# Patient Record
Sex: Female | Born: 1935 | Race: White | Hispanic: No | Marital: Single
Health system: Southern US, Community
[De-identification: ages and names within clinical notes are randomized; demographics above are authoritative.]

---

## 2013-03-22 ENCOUNTER — Inpatient Hospital Stay: Payer: Self-pay | Admitting: Specialist

## 2013-03-22 LAB — CBC WITH DIFFERENTIAL/PLATELET
Basophil #: 0 10*3/uL (ref 0.0–0.1)
Basophil %: 0.5 %
Eosinophil #: 0.2 10*3/uL (ref 0.0–0.7)
Eosinophil %: 2.1 %
HCT: 36.8 % (ref 35.0–47.0)
HGB: 12.5 g/dL (ref 12.0–16.0)
Lymphocyte #: 1.2 10*3/uL (ref 1.0–3.6)
MCHC: 33.8 g/dL (ref 32.0–36.0)
MCV: 89 fL (ref 80–100)
Monocyte %: 7.3 %
Neutrophil #: 6.7 10*3/uL — ABNORMAL HIGH (ref 1.4–6.5)
Neutrophil %: 76 %
Platelet: 160 10*3/uL (ref 150–440)
RBC: 4.14 10*6/uL (ref 3.80–5.20)
RDW: 13.6 % (ref 11.5–14.5)
WBC: 8.8 10*3/uL (ref 3.6–11.0)

## 2013-03-22 LAB — COMPREHENSIVE METABOLIC PANEL
Albumin: 3.8 g/dL (ref 3.4–5.0)
Anion Gap: 10 (ref 7–16)
BUN: 24 mg/dL — ABNORMAL HIGH (ref 7–18)
Co2: 24 mmol/L (ref 21–32)
Creatinine: 1.16 mg/dL (ref 0.60–1.30)
Glucose: 104 mg/dL — ABNORMAL HIGH (ref 65–99)
SGOT(AST): 235 U/L — ABNORMAL HIGH (ref 15–37)
Sodium: 140 mmol/L (ref 136–145)
Total Protein: 6.9 g/dL (ref 6.4–8.2)

## 2013-03-23 LAB — TROPONIN I
Troponin-I: 0.85 ng/mL — ABNORMAL HIGH
Troponin-I: 0.42 ng/mL — ABNORMAL HIGH

## 2013-03-23 LAB — CK-MB
CK-MB: 2.8 ng/mL (ref 0.5–3.6)
CK-MB: 2.7 ng/mL (ref 0.5–3.6)

## 2013-03-24 DIAGNOSIS — I369 Nonrheumatic tricuspid valve disorder, unspecified: Secondary | ICD-10-CM

## 2014-10-04 ENCOUNTER — Ambulatory Visit: Payer: Self-pay | Admitting: Ophthalmology

## 2014-11-12 ENCOUNTER — Ambulatory Visit: Payer: Self-pay | Admitting: Internal Medicine

## 2014-12-04 LAB — CBC
HCT: 35.7 % (ref 35.0–47.0)
HGB: 11.8 g/dL — ABNORMAL LOW (ref 12.0–16.0)
MCH: 30 pg (ref 26.0–34.0)
MCHC: 33.1 g/dL (ref 32.0–36.0)
MCV: 91 fL (ref 80–100)
PLATELETS: 290 10*3/uL (ref 150–440)
RBC: 3.93 10*6/uL (ref 3.80–5.20)
RDW: 12.9 % (ref 11.5–14.5)
WBC: 9.7 10*3/uL (ref 3.6–11.0)

## 2014-12-04 LAB — COMPREHENSIVE METABOLIC PANEL
ALT: 14 U/L
ANION GAP: 10 (ref 7–16)
Albumin: 3.3 g/dL — ABNORMAL LOW (ref 3.4–5.0)
Alkaline Phosphatase: 53 U/L
BUN: 34 mg/dL — ABNORMAL HIGH (ref 7–18)
Bilirubin,Total: 0.3 mg/dL (ref 0.2–1.0)
CHLORIDE: 103 mmol/L (ref 98–107)
CO2: 26 mmol/L (ref 21–32)
CREATININE: 1.66 mg/dL — AB (ref 0.60–1.30)
Calcium, Total: 8.6 mg/dL (ref 8.5–10.1)
EGFR (Non-African Amer.): 31 — ABNORMAL LOW
GFR CALC AF AMER: 37 — AB
Glucose: 115 mg/dL — ABNORMAL HIGH (ref 65–99)
Osmolality: 286 (ref 275–301)
POTASSIUM: 3.7 mmol/L (ref 3.5–5.1)
SGOT(AST): 21 U/L (ref 15–37)
SODIUM: 139 mmol/L (ref 136–145)
Total Protein: 6.5 g/dL (ref 6.4–8.2)

## 2014-12-04 LAB — APTT: Activated PTT: 29.9 secs (ref 23.6–35.9)

## 2014-12-04 LAB — PROTIME-INR
INR: 0.9
Prothrombin Time: 12.4 secs (ref 11.5–14.7)

## 2014-12-04 LAB — MAGNESIUM: Magnesium: 1.7 mg/dL — ABNORMAL LOW

## 2014-12-04 LAB — TROPONIN I

## 2014-12-04 LAB — LIPASE, BLOOD: Lipase: 72 U/L — ABNORMAL LOW (ref 73–393)

## 2014-12-05 ENCOUNTER — Inpatient Hospital Stay: Payer: Self-pay | Admitting: Surgery

## 2014-12-05 LAB — URINALYSIS, COMPLETE
BACTERIA: NONE SEEN
BILIRUBIN, UR: NEGATIVE
Blood: NEGATIVE
GLUCOSE, UR: NEGATIVE mg/dL (ref 0–75)
KETONE: NEGATIVE
Nitrite: NEGATIVE
Ph: 5 (ref 4.5–8.0)
Protein: NEGATIVE
RBC,UR: 3 /HPF (ref 0–5)
SPECIFIC GRAVITY: 1.026 (ref 1.003–1.030)
Squamous Epithelial: 1

## 2014-12-06 LAB — BASIC METABOLIC PANEL
Anion Gap: 12 (ref 7–16)
BUN: 27 mg/dL — ABNORMAL HIGH (ref 7–18)
Calcium, Total: 8 mg/dL — ABNORMAL LOW (ref 8.5–10.1)
Chloride: 110 mmol/L — ABNORMAL HIGH (ref 98–107)
Co2: 23 mmol/L (ref 21–32)
Creatinine: 1.66 mg/dL — ABNORMAL HIGH (ref 0.60–1.30)
EGFR (African American): 37 — ABNORMAL LOW
EGFR (Non-African Amer.): 31 — ABNORMAL LOW
Glucose: 105 mg/dL — ABNORMAL HIGH (ref 65–99)
Osmolality: 294 (ref 275–301)
Potassium: 4.1 mmol/L (ref 3.5–5.1)
Sodium: 145 mmol/L (ref 136–145)

## 2014-12-06 LAB — CBC WITH DIFFERENTIAL/PLATELET
Basophil #: 0 10*3/uL (ref 0.0–0.1)
Basophil %: 0.4 %
Eosinophil #: 0 10*3/uL (ref 0.0–0.7)
Eosinophil %: 0.3 %
HCT: 26.3 % — ABNORMAL LOW (ref 35.0–47.0)
HGB: 8.7 g/dL — ABNORMAL LOW (ref 12.0–16.0)
Lymphocyte #: 1 10*3/uL (ref 1.0–3.6)
Lymphocyte %: 7.3 %
MCH: 30.1 pg (ref 26.0–34.0)
MCHC: 32.9 g/dL (ref 32.0–36.0)
MCV: 92 fL (ref 80–100)
Monocyte #: 1.4 x10 3/mm — ABNORMAL HIGH (ref 0.2–0.9)
Monocyte %: 10.9 %
Neutrophil #: 10.7 10*3/uL — ABNORMAL HIGH (ref 1.4–6.5)
Neutrophil %: 81.1 %
Platelet: 238 10*3/uL (ref 150–440)
RBC: 2.87 10*6/uL — ABNORMAL LOW (ref 3.80–5.20)
RDW: 12.8 % (ref 11.5–14.5)
WBC: 13.2 10*3/uL — ABNORMAL HIGH (ref 3.6–11.0)

## 2014-12-06 LAB — TSH: Thyroid Stimulating Horm: 2.22 u[IU]/mL

## 2014-12-06 LAB — MAGNESIUM: Magnesium: 1.6 mg/dL — ABNORMAL LOW

## 2014-12-07 LAB — CBC WITH DIFFERENTIAL/PLATELET
BASOS ABS: 0.1 10*3/uL (ref 0.0–0.1)
BASOS PCT: 0.3 %
Eosinophil #: 0 10*3/uL (ref 0.0–0.7)
Eosinophil %: 0.1 %
HCT: 24.3 % — AB (ref 35.0–47.0)
HGB: 7.7 g/dL — AB (ref 12.0–16.0)
Lymphocyte #: 1.2 10*3/uL (ref 1.0–3.6)
Lymphocyte %: 6.2 %
MCH: 29.1 pg (ref 26.0–34.0)
MCHC: 31.8 g/dL — ABNORMAL LOW (ref 32.0–36.0)
MCV: 92 fL (ref 80–100)
MONOS PCT: 8.7 %
Monocyte #: 1.7 x10 3/mm — ABNORMAL HIGH (ref 0.2–0.9)
NEUTROS ABS: 16.2 10*3/uL — AB (ref 1.4–6.5)
Neutrophil %: 84.7 %
Platelet: 242 10*3/uL (ref 150–440)
RBC: 2.65 10*6/uL — ABNORMAL LOW (ref 3.80–5.20)
RDW: 13.3 % (ref 11.5–14.5)
WBC: 19.1 10*3/uL — AB (ref 3.6–11.0)

## 2014-12-07 LAB — BASIC METABOLIC PANEL
Anion Gap: 10 (ref 7–16)
BUN: 23 mg/dL — ABNORMAL HIGH (ref 7–18)
CALCIUM: 8.5 mg/dL (ref 8.5–10.1)
CHLORIDE: 115 mmol/L — AB (ref 98–107)
Co2: 24 mmol/L (ref 21–32)
Creatinine: 1.34 mg/dL — ABNORMAL HIGH (ref 0.60–1.30)
EGFR (African American): 48 — ABNORMAL LOW
EGFR (Non-African Amer.): 39 — ABNORMAL LOW
GLUCOSE: 97 mg/dL (ref 65–99)
Osmolality: 300 (ref 275–301)
Potassium: 4.2 mmol/L (ref 3.5–5.1)
Sodium: 149 mmol/L — ABNORMAL HIGH (ref 136–145)

## 2014-12-08 LAB — CBC WITH DIFFERENTIAL/PLATELET
BASOS PCT: 0.2 %
Basophil #: 0 10*3/uL (ref 0.0–0.1)
Eosinophil #: 0.1 10*3/uL (ref 0.0–0.7)
Eosinophil %: 0.6 %
HCT: 29.1 % — AB (ref 35.0–47.0)
HGB: 9.5 g/dL — AB (ref 12.0–16.0)
Lymphocyte #: 0.8 10*3/uL — ABNORMAL LOW (ref 1.0–3.6)
Lymphocyte %: 5.8 %
MCH: 28.7 pg (ref 26.0–34.0)
MCHC: 32.6 g/dL (ref 32.0–36.0)
MCV: 88 fL (ref 80–100)
Monocyte #: 1.1 x10 3/mm — ABNORMAL HIGH (ref 0.2–0.9)
Monocyte %: 8 %
NEUTROS ABS: 11.6 10*3/uL — AB (ref 1.4–6.5)
Neutrophil %: 85.4 %
Platelet: 228 10*3/uL (ref 150–440)
RBC: 3.31 10*6/uL — AB (ref 3.80–5.20)
RDW: 14.8 % — ABNORMAL HIGH (ref 11.5–14.5)
WBC: 13.5 10*3/uL — ABNORMAL HIGH (ref 3.6–11.0)

## 2014-12-08 LAB — BASIC METABOLIC PANEL
Anion Gap: 7 (ref 7–16)
BUN: 19 mg/dL — ABNORMAL HIGH (ref 7–18)
CALCIUM: 8.6 mg/dL (ref 8.5–10.1)
CO2: 26 mmol/L (ref 21–32)
Chloride: 115 mmol/L — ABNORMAL HIGH (ref 98–107)
Creatinine: 0.98 mg/dL (ref 0.60–1.30)
EGFR (African American): >60
EGFR (Non-African Amer.): 57 — ABNORMAL LOW
GLUCOSE: 99 mg/dL (ref 65–99)
OSMOLALITY: 297 (ref 275–301)
POTASSIUM: 3 mmol/L — AB (ref 3.5–5.1)
Sodium: 148 mmol/L — ABNORMAL HIGH (ref 136–145)

## 2014-12-08 LAB — MAGNESIUM: MAGNESIUM: 1.8 mg/dL

## 2014-12-09 LAB — URINALYSIS, COMPLETE
BILIRUBIN, UR: NEGATIVE
GLUCOSE, UR: NEGATIVE mg/dL (ref 0–75)
KETONE: NEGATIVE
Nitrite: POSITIVE
Ph: 6 (ref 4.5–8.0)
Protein: 25
SPECIFIC GRAVITY: 1.005 (ref 1.003–1.030)

## 2014-12-09 LAB — POTASSIUM: Potassium: 4.5 mmol/L (ref 3.5–5.1)

## 2014-12-13 ENCOUNTER — Ambulatory Visit: Payer: Self-pay | Admitting: Internal Medicine

## 2015-01-11 DEATH — deceased

## 2015-03-04 NOTE — Consult Note (Signed)
Brief Consult Note: Diagnosis: multiple rib fractures.   Patient was seen by consultant.   Discussed with Attending MD.   Comments: as recent near cardiac arrest would recommend medical admission and observation, repeat CXR in am.  Electronic Signatures: Natale LayBird, Belina Mandile (MD)  (Signed 11-May-14 20:27)  Authored: Brief Consult Note   Last Updated: 11-May-14 20:27 by Natale LayBird, Jaydrien Wassenaar (MD)

## 2015-03-04 NOTE — H&P (Signed)
PATIENT NAMDebroah [name]:  [name], [name] MR#:  [id number] DATE OF BIRTH:  1922/09/18  DATE OF ADMISSION:  03/22/2013  PRIMARY CARE PHYSICIAN: Dr. Quillian [name]   ED REFERRING PHYSICIAN: Dr. Marge DuncansMelinda [name], M.D.   CHIEF COMPLAINT: Bilateral chest pain.   HISTORY OF PRESENT ILLNESS: The patient is an 81 year old white female who currently resides at [facility] who has a history of hypertension, restless leg syndrome, depression, anxiety, hypothyroidism, gastroesophageal reflux disease who [facility] and while eating a piece of steak, the patient was started choking on the steak and her daughter attempted to perform cardiopulmonary resuscitation [facility] were [facility]; so a nurse, who was eating in the restaurant, started doing cardiopulmonary resuscitation on the patient by placing her on the floor and  started doing chest compressions and did the Heimlich maneuver and was able to sweep the piece of steak out of her mouth. The patient was brought to the ED in the ER, she was complaining of having sharp chest pain and was noted to have 4 rib fractures. We are asked to admit the patient for chest pain control. The patient states that she is hurting every time she takes a deep breath. She is not currently short of breath. She is not having any fevers or chills. She is denying any abdominal pain, nausea, vomiting, or diarrhea or any urinary symptoms.   PAST MEDICAL HISTORY: History of colonic rupture had to have a colostomy.  History of basal cell cancer of the skin, history of hypertension and restless leg syndrome. Has depression and anxiety, has a history of hypothyroidism, has a history of GERD.   PAST SURGICAL HISTORY: Status post colostomy, history of left hip fracture, history of pelvic fracture.   ALLERGIES: CODEINE, KEFLEX, NITROFURANTOIN AND MILK.   CURRENT MEDICATIONS: Clonazepam 0.5 mg at bedtime, Colace 100 mg 1 tab p.o.  b.i.d., Effexor 75 extended release daily, Lasix 20 mg 1 tab p.o. daily, and galantamine 12 mg 1 tab p.o. b.i.d., metoprolol tartrate 100 one tab p.o. b.i.d., Norvasc 5 p.o. daily, pravastatin 20 mg at bedtime, Requip 0.25 one tab p.o. b.i.d., Synthroid 50 mcg 1 tab p.o. daily, tramadol 100 q.4 p.r.n. for pain, Tylenol 325 three tabs q.8 p.r.n., Xanax 0.25 four times a day, Zantac 150 mg daily, Zoloft 150 mg daily.   SOCIAL HISTORY: Does not smoke. Does not drink. No drugs.   FAMILY HISTORY: Positive for hypertension.   REVIEW OF SYSTEMS:   CONSTITUTIONAL: Denies any fevers. Denies any fatigue or weakness. Complains of sharp bilateral chest pain. No weight loss. No weight gain.   EYES: No blurred or double vision. No redness. No inflammation. No glaucoma. No cataracts.   ENT: No tinnitus. No ear pain. Denies any seasonal or year-round allergies. No nasal discharge. No difficulty swallowing.   RESPIRATORY: Denies any cough, wheezing. No hemoptysis. Has no history of chronic obstructive pulmonary disease or tuberculosis.   CARDIOVASCULAR: Complains of bilateral chest pain. Denies any orthopnea, edema, no arrhythmia. No palpitations. No syncope.   GASTROINTESTINAL: No nausea, vomiting, diarrhea. No abdominal pain. No hematemesis. No melena. No ulcer. No GERD.   GENITOURINARY: Denies any dysuria, hematuria, renal calculus or frequency.   ENDOCRINE: Denies any polydipsia, nocturia or thyroid problems.   HEMATOLOGIC AND LYMPHATIC: Denies any major bruisability or bleeding.   SKIN: No acne. No rash. No changes in mole, hair or skin.   MUSCULOSKELETAL: No pain in the neck, back  or shoulder. Complains of pain in her ribs.   NEUROLOGIC: No numbness. No CVA. No transient ischemic attack. No seizures.   PSYCHIATRIC: Does have a history of anxiety, depression; and according to her daughter, has some mood disorder.   PHYSICAL EXAMINATION: VITAL SIGNS: Temperature 98.6, pulse 69, respirations 16,  blood pressure 141/56, O2 94%.   GENERAL: The patient is a thin, frail, Caucasian female in mild distress due to pain.   HEENT: Head atraumatic, normocephalic. Pupils equally round, reactive to light and accommodation. There is no conjunctival pallor. No scleral icterus. Nasal exam shows no drainage or ulceration.   OROPHARYNX: Clear without any exudate. External ear exam shows no drainage or erythema.   NECK: There is no thyromegaly. No carotid bruits.   CARDIOVASCULAR: Regular rate and rhythm. No murmurs, rubs, clicks or gallops. PMI is not displaced.   LUNGS: Diminished breath sounds, but there are no rales, rhonchi or wheezing.   GI: There are positive bowel sounds x4. There is no guarding. No rebound. No hepatosplenomegaly.   GENITOURINARY: Deferred.   MUSCULOSKELETAL: The patient has no erythema or swelling involving any of the joints.   SKIN: There is no rash noted.   LYMPHATICS: No cervical lymph nodes or other lymphadenopathy noted.   VASCULAR: Good DP, PT pulses.   NEUROLOGICAL:  Cranial nerves II through XII grossly intact. Strength 5/5 all 4 extremities.   PSYCHIATRIC: The patient is a little anxious.   EXTREMITIES: No clubbing, cyanosis or edema.   LABORATORY DATA: CPK 75, CK-MB 1.4. Glucose 104, BUN 24, creatinine 1.16, sodium 140, potassium 3.6, chloride 106, CO2 is 24, calcium 9.0, bilirubin total 0.2, alkaline phosphatase is 78, ALT is 197, AST is 235, total protein 6.9, troponin 0.19. Chest x-ray shows bilateral rib fractures.  Official radiology report is currently pending. EKG shows nonspecific ST-T wave changes.   ASSESSMENT AND PLAN: The patient is an 81 year old white female status post cardiopulmonary resuscitation due to choking on food, who had cardiopulmonary resuscitation performed, now has multiple rib fractures.  1. Chest pain due to bilateral multiple rib fractures. At this time due to the severity of her pain and multiple rib fractures, she will be  admitted for pain control. At this time, we will use morphine.   According to her daughter, she is very is sensitive to oral pain medications including Percocet, oxycodone, et Karie Soda. She states that morphine, she is usually okay with as well as Ultram, which we will continue.  I will also add Motrin to her current regimen to help with the anti-inflammatory aspect of her pain.  2. Elevated troponin likely due to cardiac resuscitation. We will follow cardiac enzymes.  3. Hypertension: We will hold Norvasc.  The patient's blood pressure did drop after receiving morphine. We will monitor her blood pressure.  4. Depression and anxiety. Will continue Xanax and Zoloft.  5. Hyperlipidemia: We will continue pravastatin.  6. Miscellaneous. We will do Lovenox for deep vein thrombosis prophylaxis.   TIME SPENT:  45 minutes.   ____________________________ Lacie Scotts Allena Katz, MD shp:rw D: 03/22/2013 20:48:17 ET T: 03/23/2013 10:09:59 ET JOB#: [id number]  cc: Chantry Headen H. Allena Katz, MD, <Dictator> Charise Carwin MD ELECTRONICALLY SIGNED 03/27/2013 14:39

## 2015-03-04 NOTE — Discharge Summary (Signed)
PATIENT NAMESAMEENA, [name] MR#:  [id number] DATE OF BIRTH:  1922/10/26  DATE OF ADMISSION:  03/22/2013 DATE OF DISCHARGE:  03/24/2013  For a detailed note, please take a look at the history and physical done on admission by Dr. Auburn Bilberry.   DIAGNOSES AT DISCHARGE: 1.  Chest pain, likely musculoskeletal related to the cardiopulmonary resuscitation she received.  2.  Elevated troponin likely in the setting of trauma from the cardiopulmonary resuscitation. 3.  Dementia.  4.  Hypertension.  5.  Hyperlipidemia.  6.  Hypothyroidism.  7.  Restless legs syndrome. 8.  Anxiety/depression.   The patient is being discharged on a low-sodium, low-fat diet.   ACTIVITY:  As tolerated.  FOLLOWUP:  With the primary care physician at the skilled nursing facility at [facility] in the next 1 to 2 weeks.   DISCHARGE MEDICATIONS: 1.  Galantamine 12 mg b.i.d. 2.  Requip 0.25 mg b.i.d. 3.  Colace 100 mg b.i.d. 4.  Lasix 20 mg daily. 5.  Tylenol 325 three tabs q. 8 hours as needed. 6.  Zoloft 100 mg 1.5 mg daily. 7.  Pravachol 20 mg at bedtime. 8.  Norvasc 5 mg daily. 9.  Synthroid 50 mcg daily. 10.  Zantac 150 mg daily. 11.  Effexor 75 mg daily. 12.  Metoprolol tartrate 100 mg b.i.d. 13.  Tramadol 100 mg q. 4 hours as needed for pain. 14.  Xanax 0.25 mg q.i.d. 15.  Klonopin 0.5 mg at bedtime.   CONSULTANTS DURING THE HOSPITAL COURSE:  Dr. Anda Kraft and Dr. Juliann Pulse from general surgery.  PERTINENT STUDIES DONE DURING THE HOSPITAL COURSE:  As follows: A CT of the chest done noncontrast showing no acute bony abnormality, areas of minimal infiltrate or dependent atelectasis most prominently in the right lower lobe, trace pleural effusions. There is a partial rotation of the right kidney into a more horizontal position. An echocardiogram done showing left ventricular ejection fraction to be 65% to 70%, normal LV function, impaired LV relaxation, mild LVH.  HOSPITAL COURSE:  This is an  81 year old female with medical problems as mentioned above presented to the hospital on 03/22/2013, after having a respiratory arrest secondary to choking on a piece of food.  PROBLEM: 1.  Chest pain with suspected rib fractures. The patient presented to the hospital after eating at a restaurant and choking on a piece of meat and having a Heimlich maneuver performed and receiving some cardiopulmonary resuscitation. When she presented to the ER, she was having chest pain. She also was noted to have a mildly elevated troponin. There was some suspicion that she may have had rib fractures when she got cardiopulmonary resuscitation, although her x-rays on admission did not confirm any rib fractures. She was observed, continued to have some mild chest pain, which was controlled intermittently with some tramadol. The patient underwent a CT chest noncontrast which did not show any evidence of acute rib fractures. She was therefore discharged back to her skilled nursing facility. 2. Elevated troponin. This was likely in the setting of trauma from her receiving cardiopulmonary resuscitation. Her troponin has actually trended down. She did get an echocardiogram which showed normal left ventricular function.  3.  Anxiety/depression. The patient was maintained on her Xanax and Klonopin and her Effexor and Zoloft. She will resume that upon discharge.  4.  Hypothyroidism. The patient was maintained on her Synthroid. She will resume that. 5.  Hypertension. The patient remained hemodynamically stable. She will resume her Norvasc, metoprolol and Lasix upon discharge. 6.  Restless legs syndrome.  The patient was maintained on her Requip and she will also resume that upon discharge.   THE PATIENT IS A FULL CODE.  TIME SPENT ON DISCHARGE:  35 minutes     ____________________________ [name] PancakeVivek J. Cherlynn KaiserSainani, MD vjs:ce D: 03/24/2013 16:50:03 ET T: 03/24/2013 18:13:35 ET JOB#: [id number]  cc: [name] PancakeVivek J. Cherlynn KaiserSainani, MD,  <Dictator> Primary Care Physician Houston SirenVIVEK J SAINANI MD ELECTRONICALLY SIGNED 03/31/2013 20:00

## 2015-03-04 NOTE — Consult Note (Signed)
PATIENT NAMDebroah [name]:  [name], [name] MR#:  [id number] DATE OF BIRTH:  26-Apr-1922  DATE OF CONSULTATION:  03/22/2013  REFERRING PHYSICIAN:   CONSULTING PHYSICIAN:  Randal Yepiz A. Egbert GaribaldiBird, MD  REASON FOR CONSULTATION: Evaluation of multiple rib fractures.   HISTORY OF PRESENT ILLNESS: This is an 81 year old white female visiting her sister for Mother's Day, eating in a local restaurant, apparently got choked on a piece of meat. The patient had multiple attempts at Heimlich maneuvers while in the restaurant. The patient visibly became cyanotic. She was rescued by a local nurse at the restaurant with brief episode of CPR and then dislodgment of the food bolus. She was transferred to the Emergency Room in stable condition. She is complaining of significant chest pain. Chest x-ray demonstrates no evidence of pneumothorax, no hemothorax, but multiple rib fractures on the right side.   ALLERGIES: CODEINE, KEFLEX, NITROFURANTOIN, AND MILK.   PAST MEDICAL HISTORY: Significant for hypertension, osteoarthritis, recent right hip fracture, difficulty walking, hypothyroidism, gastroesophageal reflux disease, restless leg syndrome.   PAST SURGICAL HISTORY:  Repair of right hip fracture 2 years ago.   SOCIAL HISTORY: Noncontributory.   FAMILY HISTORY: Noncontributory.   PHYSICAL EXAMINATION: GENERAL: The patient is alert and oriented, mildly demented.  VITAL SIGNS: Stable.  LUNGS: Clear bilaterally. There is significant tenderness over the chest wall. No crepitus.  ABDOMEN: Soft and nontender.   LABORATORY DATA:  Values were reviewed.   IMPRESSION:  Traumatic rib fractures secondary to cardiopulmonary resuscitation with near cardiac arrest secondary to hypoxia.   RECOMMENDATIONS: Pain medications, chest x-ray, incentive spirometer, pain control, and observation on the medical service.   ____________________________ Redge GainerMark A. Egbert GaribaldiBird, MD mab:sb D: 03/22/2013 20:36:31 ET T: 03/23/2013 09:50:55  ET JOB#: [id number]  cc: Loraine LericheMark A. Egbert GaribaldiBird, MD, <Dictator> Raynald KempMARK A Arra Connaughton MD ELECTRONICALLY SIGNED 03/23/2013 15:04

## 2015-03-13 NOTE — Op Note (Signed)
PATIENT NAMDebroah [name]:  [name], [name] MR#:  [id number] DATE OF BIRTH:  12/03/1921  DATE OF PROCEDURE:  12/05/2014  PREOPERATIVE DIAGNOSIS: Two-part intertrochanteric fracture, left hip.   POSTOPERATIVE DIAGNOSIS: Intertrochanteric fracture of the left hip following an apparent old intertrochanteric fracture of left hip which healed in varus position.   PROCEDURE: Attempted reduction, internal fixation of left intertrochanteric hip fracture using a Synthes dynamic compression screw and sideplate system with a 130 degree standard barrel 4 hole sideplate and a 70 mm lag screw.   SURGEON:  Maryagnes AmosJ. Jeffrey Poggi, MD   ANESTHESIA: Spinal.   FINDINGS: As noted above.   COMPLICATIONS: None.   ESTIMATED BLOOD LOSS: 50 mL.  TOTAL FLUIDS: 1000 mL of crystalloid.  URINE OUTPUT:  100 mL.  TOURNIQUET: None.   DRAINS: None.   CLOSURE: Staples.   BRIEF CLINICAL NOTE: The patient is a 81 year old, pleasantly demented female with multiple medical issues, who lives in a nursing home. Apparently, she fell last night onto her left hip. She was brought to the Emergency Room where x-rays demonstrated a varus impacted intertrochanteric fracture of her left hip. She was admitted to the medicine service for medical stabilization and pain control prior to definitive management of her injury. She has been cleared medically and presents at this time for definitive management of her injury.   PROCEDURE: The patient the patient was brought into the operating room. After adequate spinal anesthesia was obtained, she was lain in the supine position. Her right leg was too stiff to place in the well leg holder, so it was padded well and gently stabilized to the central beam. The left leg was placed in longitudinal traction. Numerous attempts were made to reduce the fracture, but the fracture did not move, suggesting that it was, in fact, an old fracture that had healed in that position with a subsequent new fracture causing her  acute pain. Therefore, it was felt best to proceed with stabilizing the fracture in its existing position.  The leg was positioned with some longitudinal traction and neutral rotation in order to optimize access to the proximal femur. The lateral aspect of the left hip and thigh were prepped with ChloraPrep solution before being draped sterilely. Preoperative antibiotics were administered. After establishing the location of the proximal extent of the incision fluoroscopically, an approximately 4 inch incision was made along the lateral aspect of the proximal femur. The incision was carried down through the subcutaneous tissues to expose the IT band. This was split the length of the incision. The vastus lateralis fascia was divided near its posterior border and the vastus lateralis fibers dissected bluntly along the lines of the fibers to provide access to the lateral aspect of the proximal femur. Using the 130 degree guide, a guidewire was passed up through the femoral neck into the femoral head. After insuring that is was centered both in the AP and lateral projections, it was measured and found to be 70 mm. The guidewire was over reamed 70 mm before the 70 mm screw was inserted. It was driven through within 5 mm of subchondral bone. The 4 hole side plate was slid down over the construct to lie flat against the lateral aspect of the proximal femur. It was then secured using four bicortical placed screws. The adequacy of fracture stabilization and hardware position was verified in AP and lateral projections fluoroscopically and found to be excellent.   The wound was copiously irrigated with sterile saline solution before the iliotibial band was reapproximated using #  0 Vicryl interrupted sutures. The subcutaneous tissues were closed in 2 layers using 2-0 Vicryl interrupted sutures before the skin was closed using staples. A sterile bulky dressing was applied to the lateral aspect of the hip before the patient was  awakened, and returned to the recovery room in satisfactory condition after tolerating the procedure well.    ____________________________ J. Derald Macleod, MD jjp:LT D: 12/05/2014 15:01:15 ET T: 12/05/2014 21:28:01 ET JOB#: [id number]  cc: Maryagnes Amos, MD, <Dictator> JEFF Fidel Levy MD ELECTRONICALLY SIGNED 12/07/2014 17:36

## 2015-03-13 NOTE — H&P (Signed)
PATIENT NAMDebroah [name]:  [name], [name] MR#:  [id number] DATE OF BIRTH:  02-20-1922  DATE OF ADMISSION:  12/05/2014  REFERRING PHYSICIAN: Eartha Inchory R. [name] CeriseForbach, MD   PRIMARY CARE PHYSICIAN: [name].   ADMISSION DIAGNOSIS: Hip fracture.   HISTORY OF PRESENT ILLNESS: This is a 81 year old Caucasian female who presents to the Emergency Room via EMS after sustaining a fall. Details of the circumstances surrounding her fall are unclear; however, it is reported that the patient never had any loss of consciousness, never struck her head, or complained of any symptoms that would indicate cardiopulmonary problems. In the Emergency Department, the patient had an x-ray of her hip, which revealed an intertrochanteric fracture of her left femur which prompted the Emergency Department to call  orthopedic surgery for guidance. They suggested admission to the internal medicine service, for which the Emergency Department staff contacted the hospitalist for admission.   REVIEW OF SYMPTOMS: The patient does not speak coherently or intelligibly consistently enough to contribute to her own review of systems. She actually denied pain when asked; however, she clearly is uncomfortable based on her positioning in bed and her obvious angulated injury to the left hip.   PAST MEDICAL HISTORY: Hypertension, hypothyroidism, dementia, restless leg syndrome, and history of basal cell carcinoma of the skin per older documentation.   PAST SURGICAL HISTORY: The patient has undergone an internal fixation for right hip repair. She has also had colectomy and colostomy bag placement.   SOCIAL HISTORY: The patient reportedly does not smoke, drink, or do any drugs. Her daughter is somehow involved in her life, but was not available to contribute to her mom's history as she has seemed to potentially have been intoxicated on the phone.   FAMILY HISTORY: Unobtainable as the patient is severely demented and cannot contribute to her own medical or family  history.   MEDICATIONS:  1.  Albuterol 2.5 mg per 3 mL inhaled solution 1 nebulizer treatment every 4 hours as needed for coughing, wheezing, or shortness of breath.  2.  Amoxicillin 500 mg 4 capsules by mouth 2 hour prior to appointment (this does not appear to be a current medication).  3.  Artificial Tears ophthalmologic solution 1 drop to each affected eye every 4 hours as needed.  4.  Combigan 0.2%--0.5% ophthalmic solution 1 drop to each affected eye every 12 hours.  5.  DOK sodium 100 mg oral capsule 1 capsule p.o. b.i.d.  6.  Durezol 0.05% ophthalmologic emulsion 1 drop to the right eye 3 times per day.  7.  Effexor XR 150 mg extended release capsule 1 capsule p.o. daily.  8.  Erythromycin ophthalmic ointment 0.5% applied to the right eye at bedtime.  9.  Furosemide 20 mg oral tablet 1 tablet p.o. daily.  10.  Lopressor 100 mg 1 tablet p.o. b.i.d.  11.  Lumigan 0.01% ophthalmic solution 1 drop to the left eye once a day in the evening.  12.  Norvasc 5 mg 1 tablet p.o. daily.  13.  Requip 0.25 mg 1 tablet p.o. b.i.d.  14.  Seroquel 100 mg 1 tablet p.o. at bedtime.  15.  Synthroid 25 mcg 1 tablet p.o. daily.  16.  Trusopt 2% ophthalmic solution 1 drop to each affected eye b.i.d.  17.  Tylenol 325 mg 3 tablets p.o. every 8 hours.  18.  Ultram 50 mg 2 tablets p.o. every 4 hours as needed for pain.  19.  Xanax 0.25 mg 1 tablet p.o. 4 times a day.  20.  Zantac  150 mg 1 tablet p.o. daily.   ALLERGIES: CODEINE, KEFLEX, AND NITROFURANTOIN.   PERTINENT LABORATORY RESULTS AND RADIOGRAPHIC FINDINGS: Serum glucose is 115, BUN 34, creatinine 1.66, serum sodium 139, potassium 3.7, chloride is 103, bicarbonate 26, calcium 8.6, magnesium 1.7, lipase 72, serum albumin is 3.3, alkaline phosphatase 53, AST 21, ALT 14. Troponin is negative. White blood cell count is 9.7, hemoglobin of 11.8, hematocrit 35.7, platelet count 290,000, MCV is 91. INR 0.9. Urinalysis shows 2+ leukocyte esterase, nitrite  negative, and no bacteria seen. X-ray of the left hip shows an angulated intertrochanteric left femur fracture with remote-appearing bilateral obturator ring fractures and remote intertrochanteric right femur fracture. Chest x-ray shows no active cardiopulmonary disease.   PHYSICAL EXAMINATION:  VITAL SIGNS: Temperature is 97.9, pulse 106, respirations 17, blood pressure 165/85, pulse oximetry is 96% on 3 liters of oxygen via nasal cannula.  GENERAL: The patient is alert and oriented x 1. She is in no apparent distress.  HEENT: Normocephalic, atraumatic. Pupils equal, round, and reactive to light and accommodation. Extraocular movements are intact. Mucous membranes are somewhat tacky.  NECK: Trachea is midline. No adenopathy. Thyroid is nonpalpable, nontender.  CHEST: Symmetric and atraumatic.  CARDIOVASCULAR: Regular rate and rhythm. Normal S1, S2. No rubs, clicks, or murmurs appreciated.  LUNGS: Clear to auscultation bilaterally. Normal effort and excursion.  ABDOMEN: Positive bowel sounds. Soft, nontender, nondistended. There is no hepatosplenomegaly. There is a colostomy bag in place.  GENITOURINARY: Deferred.  MUSCULOSKELETAL: The patient has an externally rotated left leg that is flexed at the knee. She is reluctant to move that leg at all. She moves both upper extremities equally, but does not specifically participate in strength testing likely due to her dementia.  SKIN: There are warm and dry. There are no rashes or lesions.  EXTREMITIES: No clubbing, cyanosis, or edema.  NEUROLOGIC: Cranial nerves II through XII are grossly intact.  PSYCHIATRIC: Mood is pleasant. Affect is congruent. The patient's judgment and insight into her condition appears to be very poor as she does not frequently form intelligible sentences.   ASSESSMENT AND PLAN: This is a 81 year old female admitted for an intertrochanteric fracture of the left femur.  1.  Femur fracture of the left lower extremity. The fracture  is closed and angulated. The Emergency Department has called the Orthopedic Surgery Department, who will see the patient in the morning. Our goal for now is pain control.  2.  Acute kidney injury. I have started the patient on intravenous fluid to maximize her renal function for potential surgery in the morning. She is n.p.o. at this time, so I have included some dextrose fluid.  3.  Hypertension. We will continue amlodipine and metoprolol.  4.  Hypothyroidism. Continue Synthroid.  5.  Dementia. We will continue the patient's psychiatric and sedative medications that she takes for agitation.  6.  Restless leg syndrome, continue Requip.  7.  Deep vein thrombosis prophylaxis. Heparin.  8.  Gastrointestinal prophylaxis. None.   TIME SPENT ON PATIENT CARE AND ADMISSION ORDERS: Approximately 35 minutes.   ____________________________ Kelton Pillar. Sheryle Hail, MD msd:ts D: 12/05/2014 04:24:49 ET T: 12/05/2014 04:52:55 ET JOB#: [id number]  cc: Kelton Pillar. Sheryle Hail, MD, <Dictator> Kelton Pillar Anasofia Micallef MD ELECTRONICALLY SIGNED 12/15/2014 6:56

## 2015-03-13 NOTE — Consult Note (Signed)
PATIENT NAMDebroah [name]:  [name], [name] MR#:  [id number] DATE OF BIRTH:  [birth year]  DATE OF CONSULTATION:  12/05/2014  REFERRING PHYSICIAN:  Enid Baasadhika Kalisetti, MD  CONSULTING PHYSICIAN:  Maryagnes AmosJ. Jeffrey Tulip Meharg, MD  REASON FOR CONSULTATION: I have been asked by Dr. Nemiah CommanderKalisetti to evaluate this pleasantly demented elderly woman for a left hip injury.   HISTORY OF PRESENT ILLNESS: Briefly, she is a 81 year old female with a past medical history of dementia, hypertension, hypothyroidism, restless leg syndrome and a history of basal cell skin cancer who is a nursing home resident. She apparently sustained a fall while at the nursing home last evening, although the circumstances around the fall are unclear. She apparently had no loss of consciousness and did not complain of any chest pain or shortness of breath. There are no apparent associated injuries. She complained of left hip pain and was unable to get up; therefore, she was brought to the emergency room where x-rays demonstrated an intertrochanteric fracture of her left hip. She subsequently was admitted to the medical service for medical stabilization and pain control in anticipation of definitive management of her injury.   PAST MEDICAL HISTORY: As noted above.   PAST SURGICAL HISTORY: Is notable for undergoing internal fixation of a right hip fracture approximately 4 years ago. She has a colostomy bag after having undergone a colectomy in the past, although the reason for the colectomy is unclear.   PHYSICAL EXAMINATION:  GENERAL: On examination, we have a pleasant, elderly female resting comfortably in bed. She appears to be somewhat emaciated and older than her stated age. She is awake and alert but unable to respond appropriately to questions.  EXTREMITIES: Orthopedic examination is limited to her left hip and lower extremity. Her hip is quite flexed and externally rotated as compared to the right lower extremity. She has pain with any attempt at active or  passive motion of the hip. She can withdraw her leg and foot to foot stimulation, suggesting intact sensation and generally intact motor to the left lower extremity. She has good capillary refill to her left foot.   DIAGNOSTIC DATA: X-rays of her pelvis and left hip are available for review. These films demonstrate what appears to be an intertrochanteric fracture to her left hip with significant varus of the injury, precluding a more detailed evaluation of the fracture. The hip joint itself appears to be well maintained. Significant osteopenia is noted.   ADMISSION LABORATORY DATA: Includes a slightly elevated glucose at 115, an elevated BUN of 34 with an elevated creatinine of 1.66. Her total protein is 6.5 with a serum albumin at 3.3. Her white count is normal at 9.7. She has a hematocrit of 35.7 with a hemoglobin of 11.8 and a platelet count 290,000.   IMPRESSION: Intertrochanteric fracture, left hip.   PLAN: The patient is to be admitted at this time to the hospitalist service. Once she is cleared medically, she will be taken to the operating room for definitive management of her injury to include intramedullary nailing of the left hip fracture using a trochanteric femoral nail. The procedure has been discussed in detail with the patient's daughter, as have the potential risks (including bleeding, infection, nerve end or blood vessel injury, persistent or recurrent pain, stiffness of the hip, malunion and/or nonunion, need for further surgery, blood clots, strokes, heart attacks and/or arrhythmias, etc.) and benefits. The patient's daughter states her understanding and agrees to proceed.   Thank you for asking me to participate in the care of this  most a pleasant, yet unfortunate, woman. I will be happy to follow her with you.     ____________________________ J. Derald Macleod, MD jjp:TT D: 12/05/2014 13:13:57 ET T: 12/05/2014 17:11:55 ET JOB#: [id number]  cc: Maryagnes Amos, MD,  <Dictator> JEFF Fidel Levy MD ELECTRONICALLY SIGNED 12/07/2014 17:32

## 2015-03-13 NOTE — Discharge Summary (Signed)
PATIENT NAMENIARA, [name] MR#:  [id number] DATE OF BIRTH:  02-Sep-1922  DATE OF ADMISSION:  12/05/2014 DATE OF DISCHARGE:    DISCHARGE DIAGNOSES: 1. Anemia, acute blood loss from surgery, status post transfusion of 1 packed red blood cell on 12/07/2014.  2. Acute delirium with underlying dementia and depression.  3. Hypokalemia, repleted and resolved. 4. Sinus tachycardia. 5. Low-grade fever due to urinary tract infection.  6. Urinary tract infection based on urine findings.  7. Hyponatremia. 8. Intertrochanteric left femur fracture, status post open reduction internal fixation on January 24th, postoperative day 4.   SECONDARY DIAGNOSES:   Hypertension, hypothyroidism, dementia, restless leg syndrome, and history of basal cell carcinoma of the skin per older documentation.   CONSULTATIONS:  1. Ninfa Linden, MD, Orthopedics  2. Palliative care.  3. Physical therapy.  4. Speech therapy.   PROCEDURES AND RADIOLOGY:  1. Left reduction and internal fixation of the left hip by Dr. Joice Lofts on January 24th. 2. Left hip x-ray on January 23rd showed angulated intertrochanteric left femur fracture. Remote appearing bilateral obturator ring fractures.  3. Remote intertrochanteric right femur fracture.  4. Chest x-ray on January 23rd showed no active disease.  5. Left hip x-ray on January 24th status post intraoperative repair of the intertrochanteric left femoral fracture. Metallic fixation material in the proximal left femur. Near anatomic alignment.  6. Chest x-ray on January 27th showed no active cardiopulmonary disease. Age indeterminate lower thoracic and upper lumbar spine compression deformities.   DIAGNOSTIC DATA:   Urinalysis on admission was negative. Repeat UA on January 28th  showed too numerous WBCs, 3+ leukocyte esterase, positive nitrite, 2+ bacteria.   HISTORY AND SHORT HOSPITAL COURSE: The patient is a 81 year old female with the above history of medical problems who was  admitted for left open angulated intertrochanteric femur fracture. Please see Dr. Casimiro Needle Diamond's dictated history and physical for further details.   Orthopedic consultation was obtained with Dr. Ninfa Linden who recommended surgery, which was performed on January 24th with reduction and internal fixation of left femur fracture. Postoperatively, the patient was seen by physical and speech therapy, was slowly improving with rehab, although she did remain some confused and lethargic at times for which palliative care consultation was obtained, where they had a discussion with family, the decision was made to change her code status to DO NOT RESUSCITATE. At this point, family still wanted to give a chance for rehab potential, for which she is being transferred to rehab facility. If she fails, she may get evaluated for hospice. She will be followed by palliative care for evaluation and management as needed at the rehab facility. Her daughter, with whom I talked, is somewhat concerned about her getting too much Xanax, which I have stopped at this point, as daughter feels that due to Xanax, she is unable to eat or drink and gets more and more weak.  She is being discharged to rehab facility in fair condition.   On the date of discharge, her vital signs are as follows: Temperature 97.9, heart rate 95 per minute, respirations 18 per minute, blood pressure 135/69, and she is saturating 94% on room air.   PHYSICAL EXAMINATION: On the date of discharge:  CARDIOVASCULAR: S1, S2 normal. No murmurs, rubs or gallop.  LUNGS: Clear to auscultation bilaterally. No wheezing, rales, rhonchi, crepitation.  ABDOMEN: Soft, benign.  NEUROLOGIC: The patient is somewhat alert, but remains pleasantly confused. She does not follow many commands, except simple commands. Her speech was clear.  All other physical examination remained at baseline. Her left leg incision looks good. No signs of infection noted.   DISCHARGE MEDICATIONS:    Medication Instructions  trusopt 2% ophthalmic solution  1 drop(s) to each affected eye 2 times a day instill into left eye   combigan 0.2%-0.5% ophthalmic solution  1 drop(s) to each affected eye every 12 hours   lopressor 100 mg oral tablet  1 tab(s) orally 2 times a day   requip 0.25 mg oral tablet  1 tab(s) orally 2 times a day   dok sodium 100 mg oral capsule  1 cap(s) orally 2 times a day   effexor xr 150 mg oral capsule, extended release  1 cap(s) orally once a day   furosemide 20 mg oral tablet  1 tab(s) orally once a day   zantac 150 mg oral tablet  1 tab(s) orally once a day   norvasc 5 mg oral tablet  1 tab(s) orally once a day   synthroid 25 mcg (0.025 mg) oral tablet  1 tab(s) orally once a day   artificial tears preserved ophthalmic solution  1 drop(s) to each affected eye every 4 hours, As Needed   albuterol 2.5 mg/3 ml (0.083%) inhalation solution  3 milliliter(s) inhaled every 4 hours, As Needed - for Wheezing   durezol 0.05% ophthalmic emulsion  1 drop(s) to right eye 3 times a day   tylenol 325 mg oral tablet  3 tab(s) orally every 8 hours   lumigan 0.01% ophthalmic solution  1 drop(s) to left eye once a day (in the evening)   erythromycin ophthalmic 0.5% ophthalmic ointment  apply to right eye qhs   seroquel 100 mg oral tablet  1 tab(s) orally once a day (at bedtime)   ultram 50 mg oral tablet  2 tab(s) orally every 4 hours, As Needed - for Pain   artificial tears preserved ophthalmic solution  1 drop(s) to each affected eye once a day   erythromycin ophthalmic 0.5% ophthalmic ointment  apply to right eye up to 4 times as day as needed for foreign body   lovenox 40 mg/0.4 ml injectable solution   injectable once a day   levaquin 500 mg oral tablet  1 tab(s) orally every 24 hours x 3 days     DISCHARGE DIET: Regular. Mechanical soft with thin liquids, strict aspiration precautions including no straws. Medication in puree. Reduce distraction during mass. Allow patient  to do as much for herself and place towels in the lap. Be patient. She was recommended ground meats with pureed meats with diet. supportive drink with a lead  and spot and/or dysphagia drink could be beneficial for patient when drinking. Speech therapy also recommended providing food items of her choice including a peanut butter sandwich as she may eat more. Strict aspiration precautions to be followed. Medication in puree, crushed as able. Also recommended dietitian consult while at the facility.   DISCHARGE ACTIVITY: As tolerated.   DISCHARGE INSTRUCTIONS AND FOLLOWUP: [facility] Dr. Ninfa LindenJeff Poggi, in 1 to 2 weeks. She will need follow-up with Dr. Clayborn BignessKatherine Bliss while at the facility, in 2 to 4 weeks. She will get physical therapy evaluation and management while at the facility. If she fails that, she will need palliative care evaluation for consideration of hospice. She does have very poor prognosis. Her code status is DO NOT RESUSCITATE. Please note, I have recommended to stop Xanax at this point as  family is very concerned that the patient is not staying awake enough to perform anything.   TOTAL TIME DISCHARGING THIS PATIENT:  Was 55 minutes.    Please note, she remains at very high risk for readmission.    ____________________________ Ellamae Sia. Sherryll Burger, MD vss:kl D: 12/09/2014 13:01:29 ET T: 12/09/2014 13:42:11 ET JOB#: [id number]  cc: Demitrus Francisco S. Sherryll Burger, MD, <Dictator> Burley Saver, MD [place], MD Ned Grace, MD Ellamae Sia [facility] MD ELECTRONICALLY SIGNED 12/09/2014 15:48

## 2016-05-18 IMAGING — CR DG CHEST 1V
1 series · 1 of 1 positions shown · non-contrast
Comparison: 03/23/2013

CLINICAL DATA: Preop left hip fracture

EXAM:
CHEST - 1 VIEW

[ap]
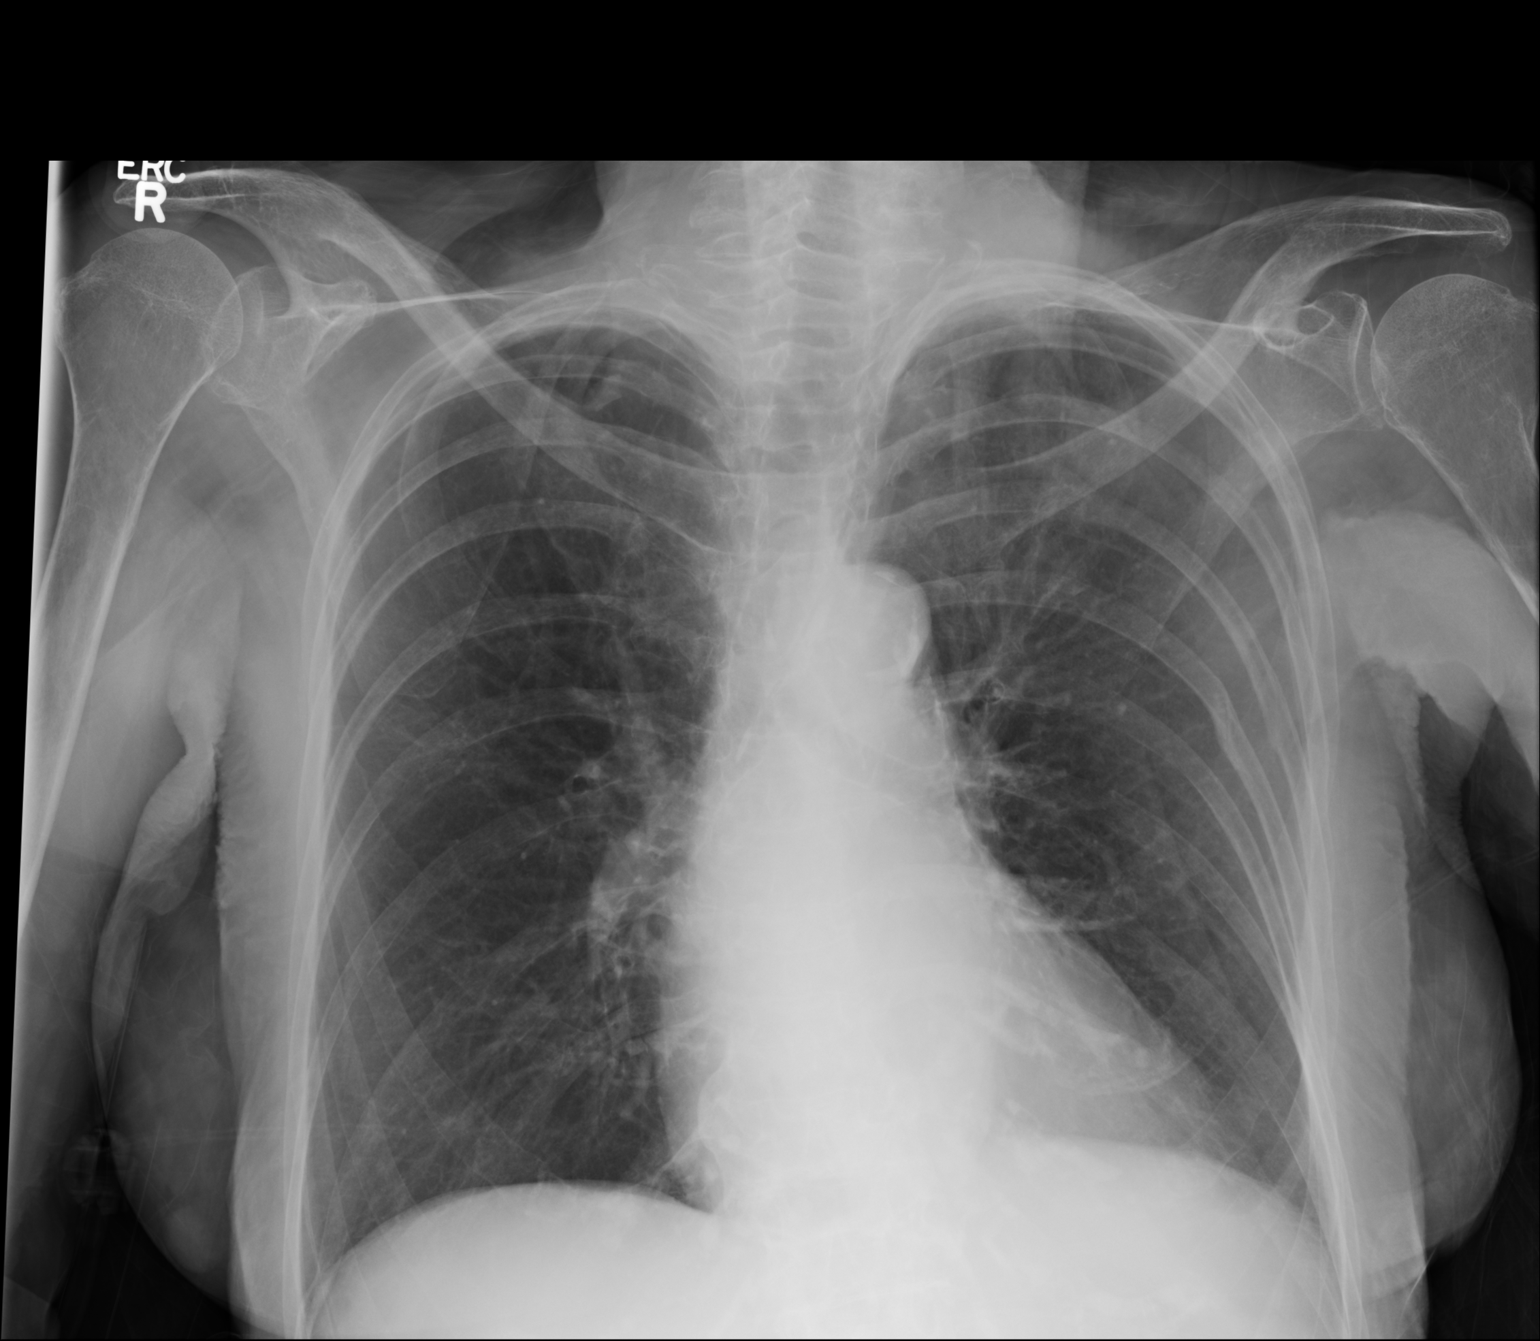

[1 of 1 positions shown; findings below may reference images not displayed]

FINDINGS: No cardiomegaly. Aortic hilar contours are negative. The patient
suffered remote left-sided rib fractures; no acute fracture
identified. There is no edema, consolidation, effusion, or
pneumothorax.
IMPRESSION: No active disease.

## 2016-05-22 IMAGING — CR DG CHEST 2V
1 series · 2 of 2 positions shown · non-contrast
Comparison: 12/04/2014

CLINICAL DATA: Fever

EXAM:
CHEST  2 VIEW

[Series 1: dxr chest pa (or ap) and lateral · 0.14mm/px · 2 of 2 slices shown]
[im 1/2]
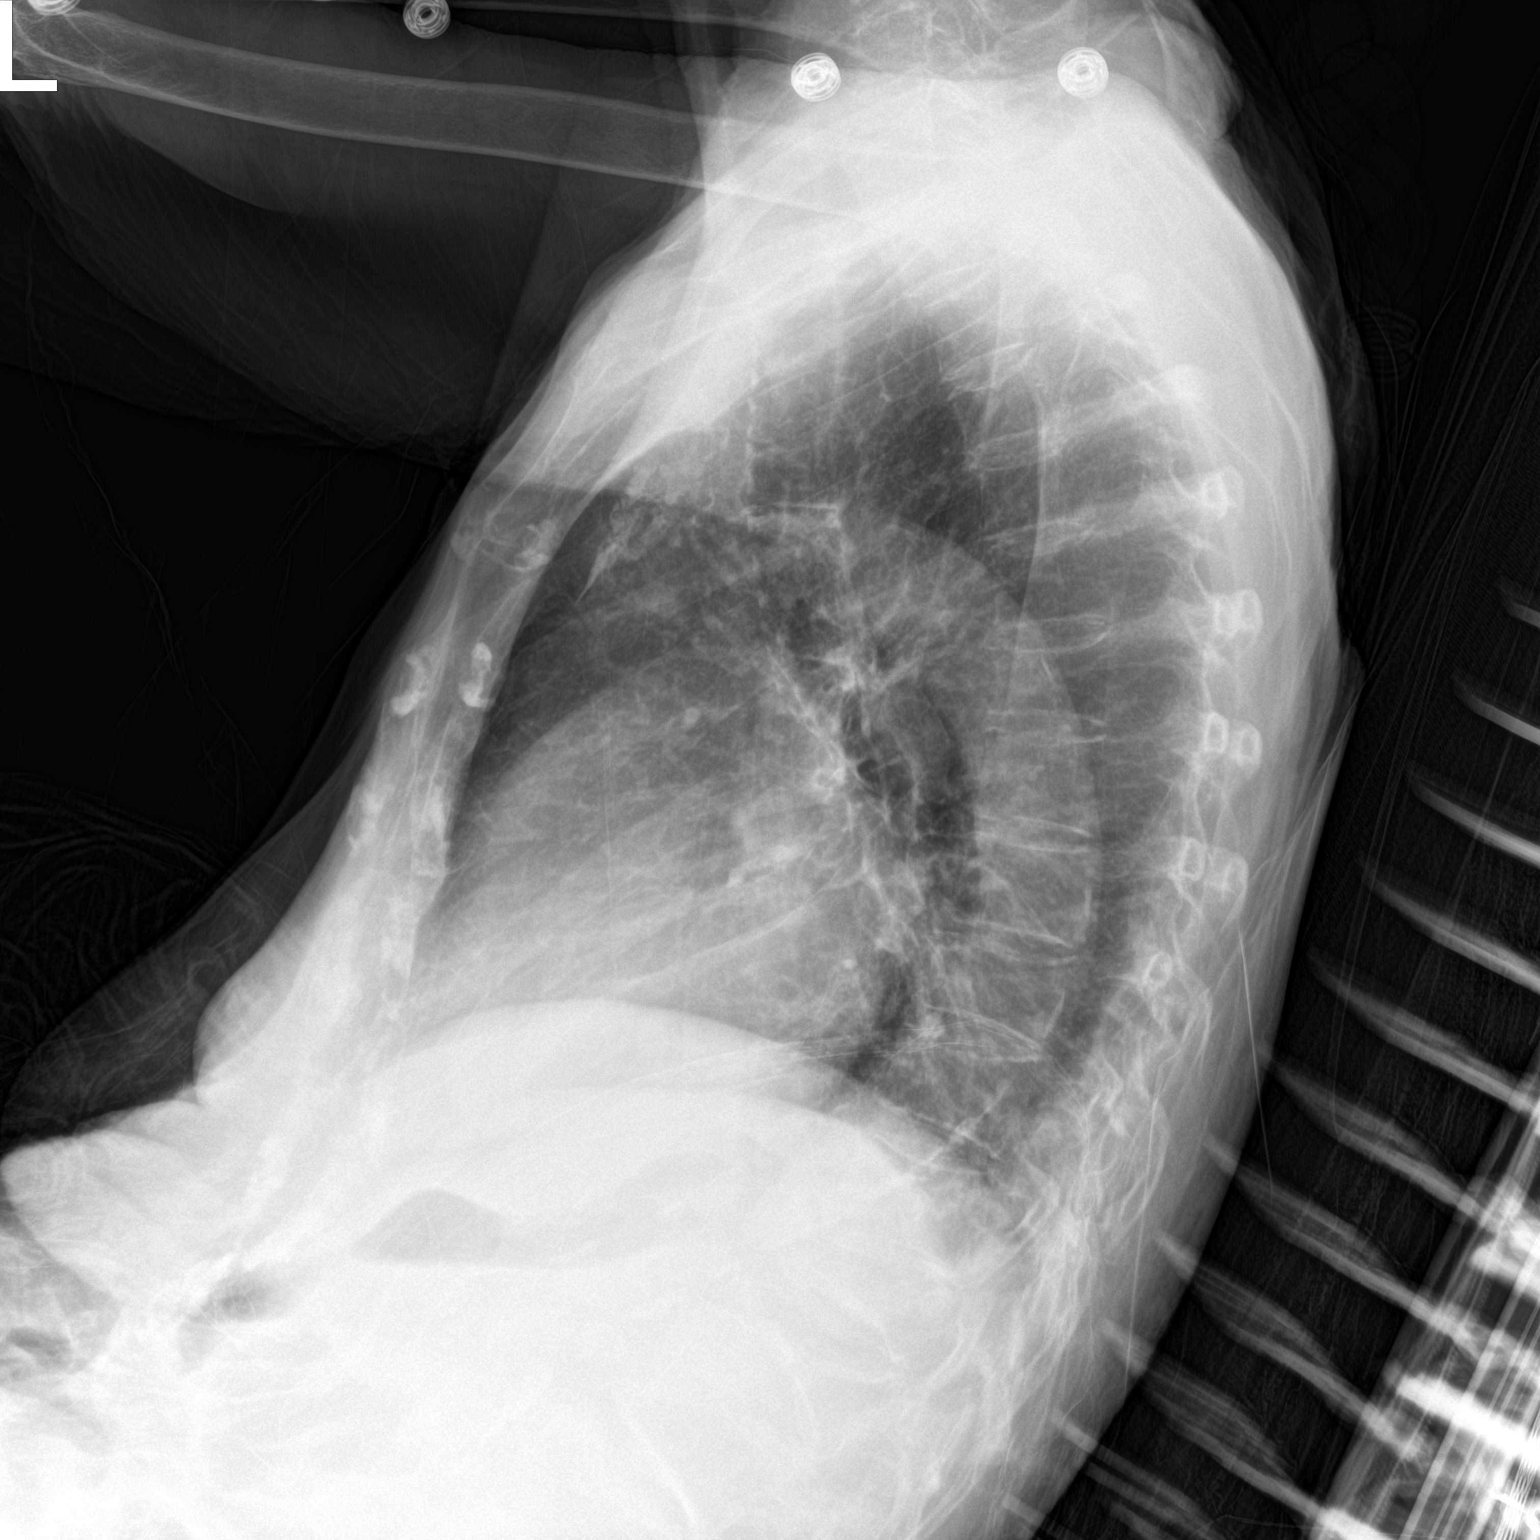
[im 2/2]
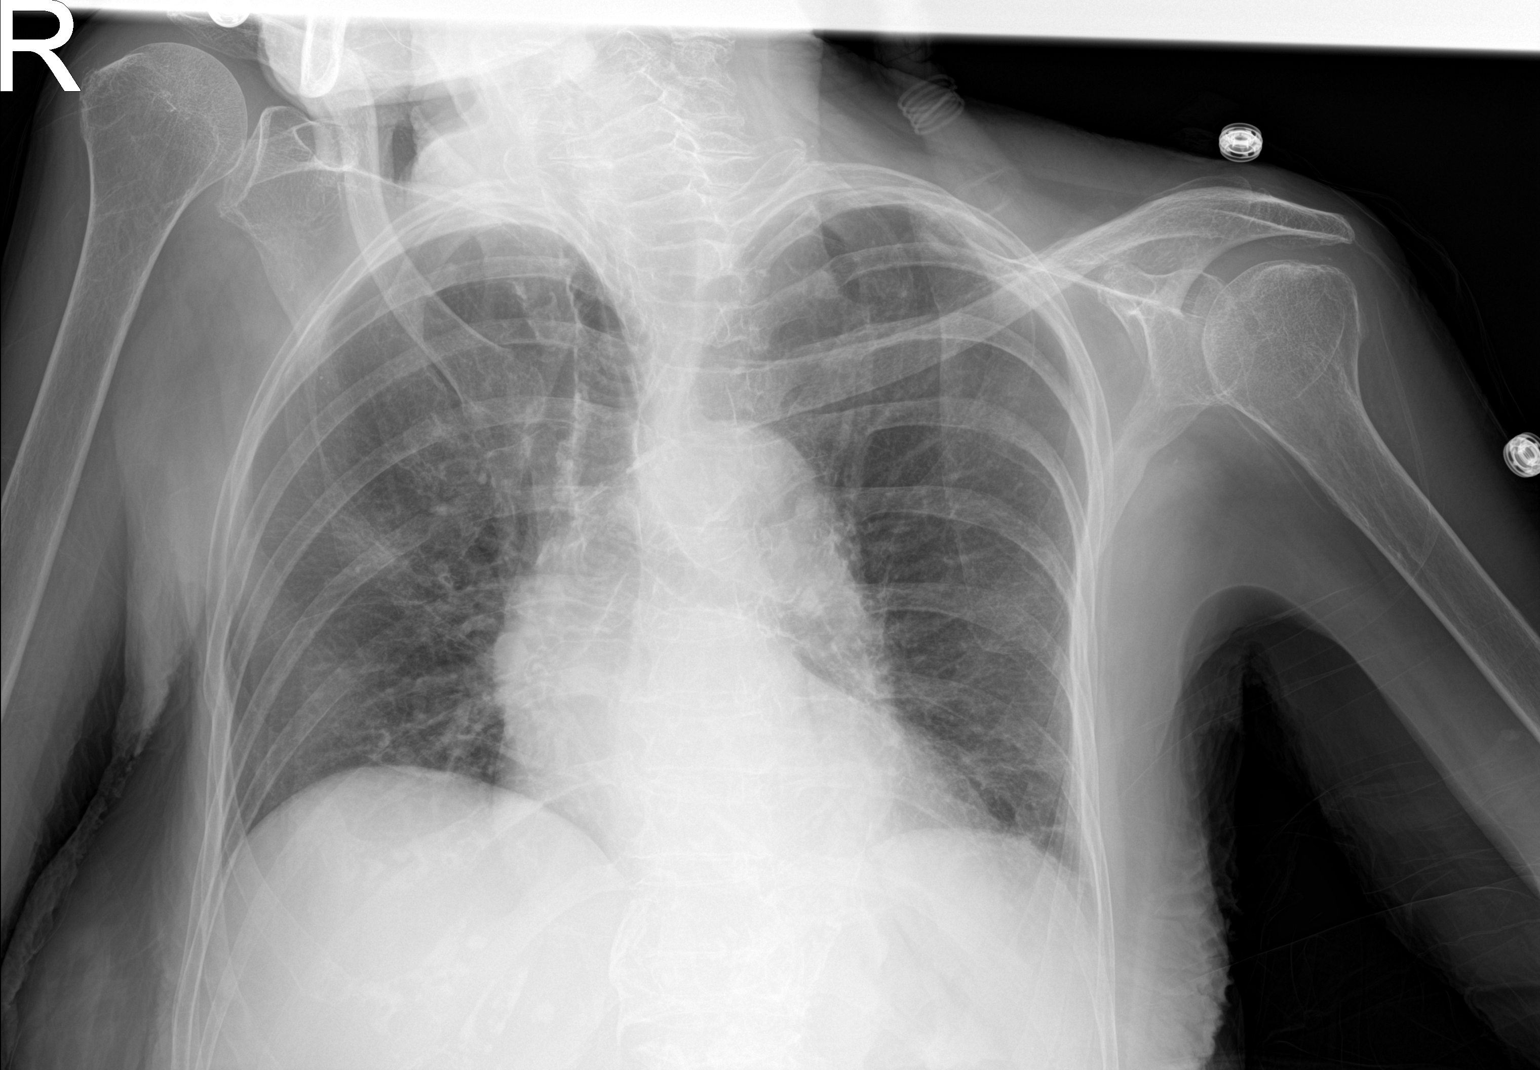

[2 of 2 positions shown; findings below may reference images not displayed]

FINDINGS: Heart size is normal. The lung volumes are low. There is no airspace
consolidation or atelectasis. No pleural effusion or edema.
Compression deformities within the lower thoracic/ upper lumbar
spine are identified. These are age indeterminate.
IMPRESSION: 1. No active cardiopulmonary abnormalities.
2. Age indeterminate lower thoracic and upper lumbar spine
compression deformities.
